# Patient Record
Sex: Male | Born: 1945 | Race: White | Hispanic: No | Marital: Married | Smoking: Never smoker
Health system: Southern US, Community
[De-identification: ages and names within clinical notes are randomized; demographics above are authoritative.]

## PROBLEM LIST (undated history)

## (undated) DIAGNOSIS — K259 Gastric ulcer, unspecified as acute or chronic, without hemorrhage or perforation: Secondary | ICD-10-CM

## (undated) HISTORY — PX: STOMACH SURGERY: SHX791

## (undated) HISTORY — PX: APPENDECTOMY: SHX54

---

## 2004-04-26 ENCOUNTER — Ambulatory Visit: Payer: Self-pay | Admitting: Internal Medicine

## 2004-05-04 ENCOUNTER — Ambulatory Visit: Payer: Self-pay | Admitting: Internal Medicine

## 2004-09-21 ENCOUNTER — Ambulatory Visit: Payer: Self-pay | Admitting: Surgery

## 2005-11-28 ENCOUNTER — Other Ambulatory Visit: Payer: Self-pay

## 2005-11-28 ENCOUNTER — Inpatient Hospital Stay: Payer: Self-pay | Admitting: Surgery

## 2006-11-20 IMAGING — CT CT ABD-PELV W/ CM
1 of 4 series · 10 of 32 positions shown, 16 images · non-contrast
Comparison: none

REASON FOR EXAM: (1) po and iv contrast, abd pain//RM15; (2) po and iv
contrast, flank pain
COMMENTS:

PROCEDURE:     CT  - CT ABDOMEN / PELVIS  W  - November 28, 2005 [DATE]
RESULT:
REASON FOR CONSULTATION: Abdominal pain in the umbilical area.

[Series 2: soft tissue · axial · 0.75mm/px · z∈[-825,-438]mm · 10 of 159 slices shown, 16 images]
[im 15/159  soft-tissue]
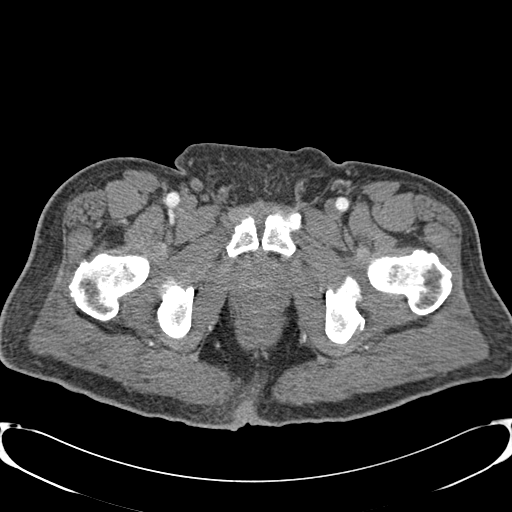
[im 15/159  bone]
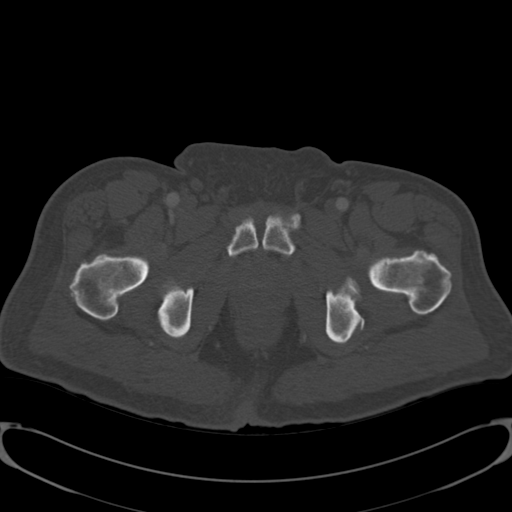
[im 29/159  soft-tissue]
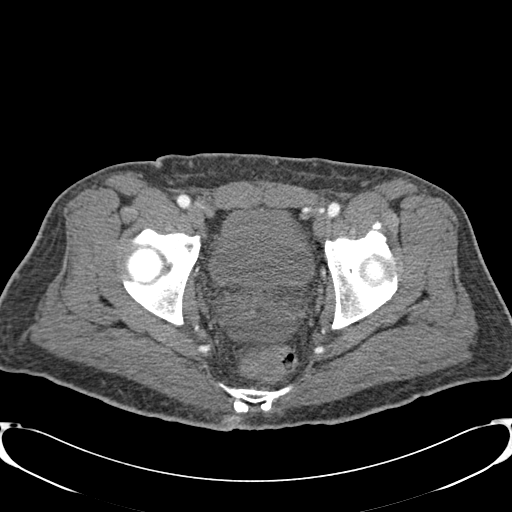
[im 44/159  soft-tissue]
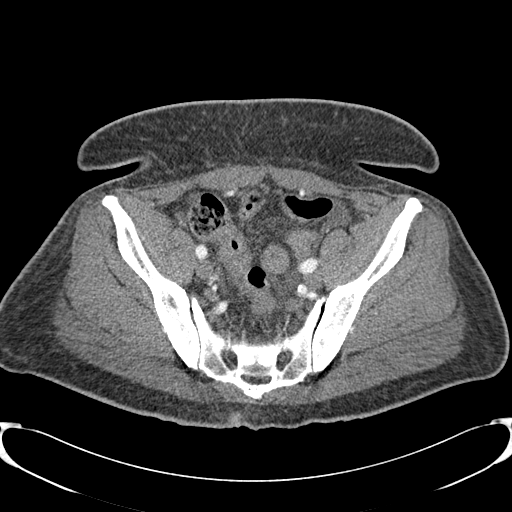
[im 58/159  soft-tissue]
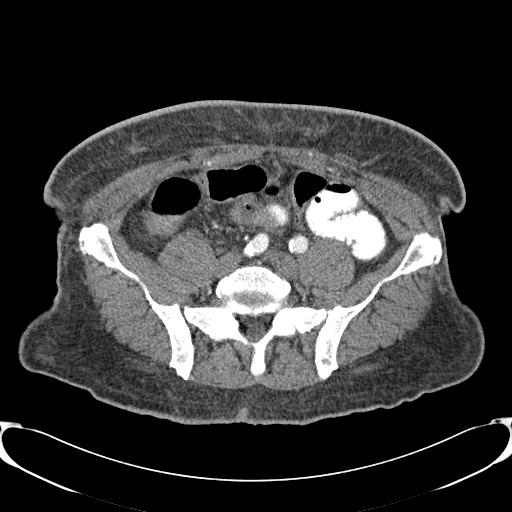
[im 72/159  soft-tissue]
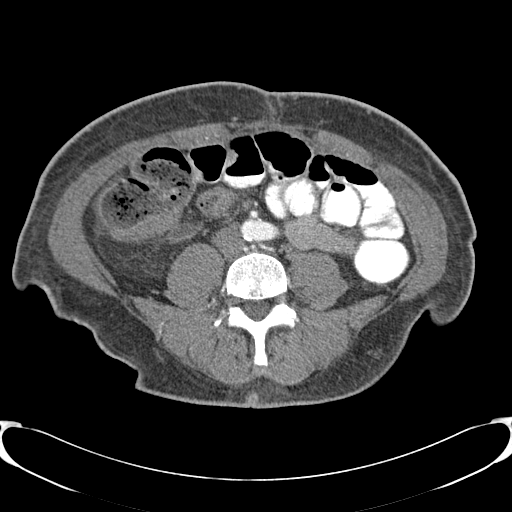
[im 87/159  soft-tissue]
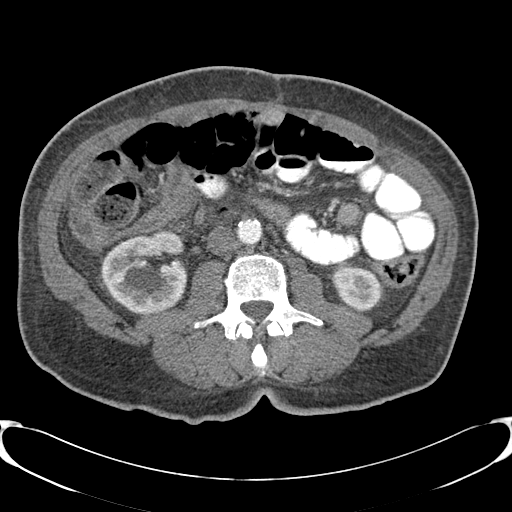
[im 101/159  soft-tissue]
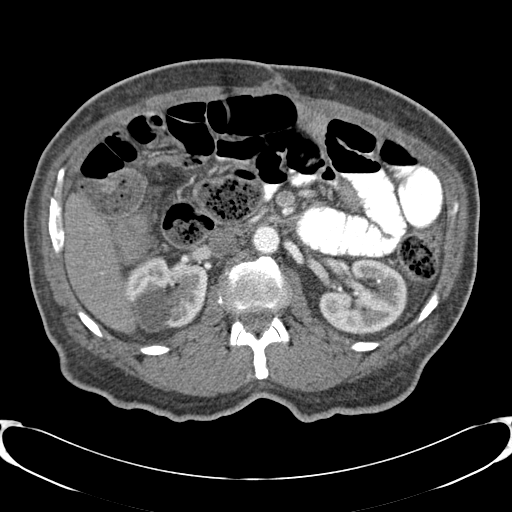
[im 101/159  lung]
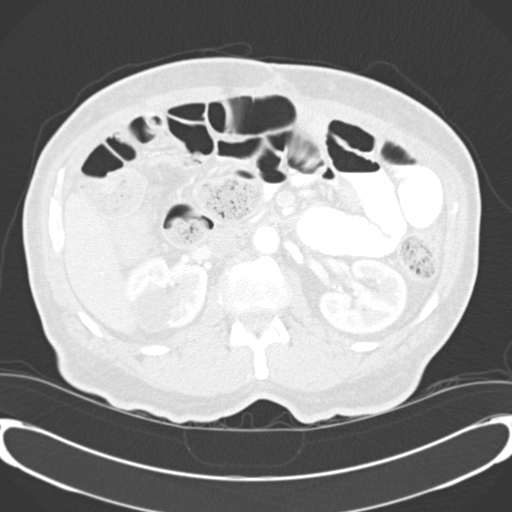
[im 115/159  soft-tissue]
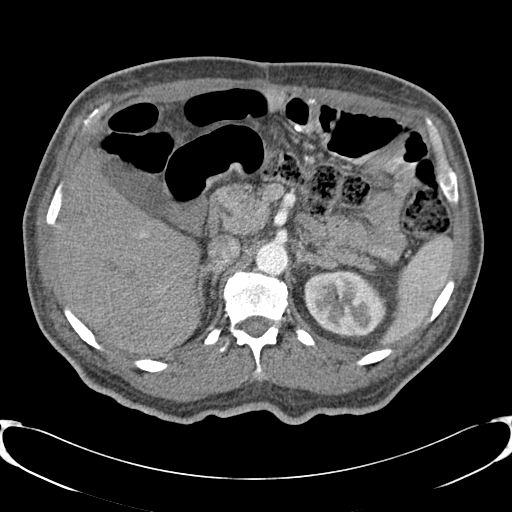
[im 115/159  lung]
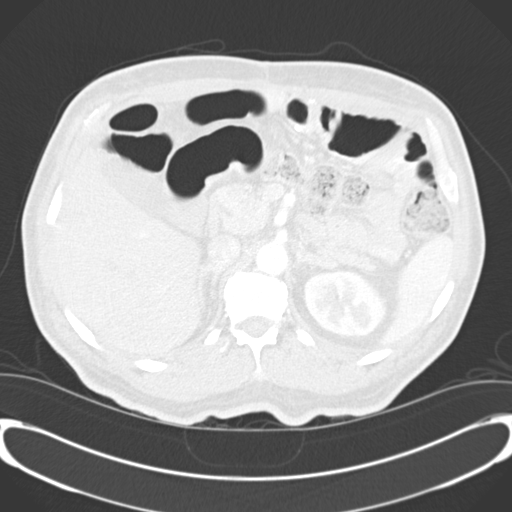
[im 130/159  soft-tissue]
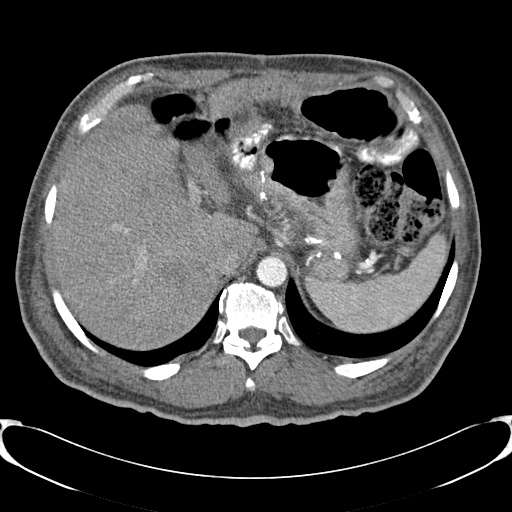
[im 130/159  lung]
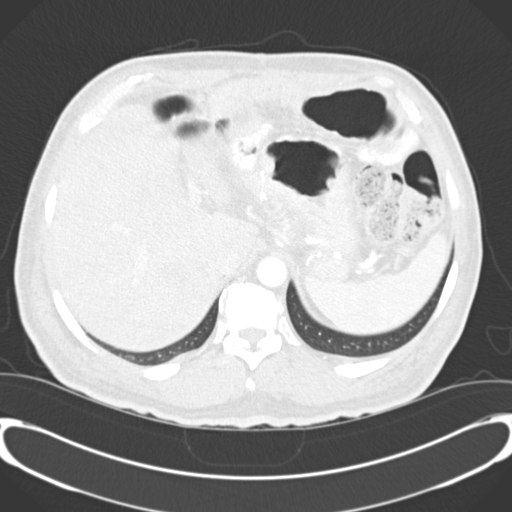
[im 130/159  bone]
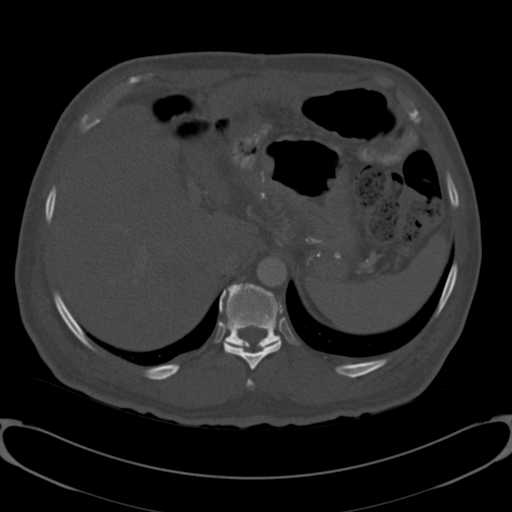
[im 144/159  soft-tissue]
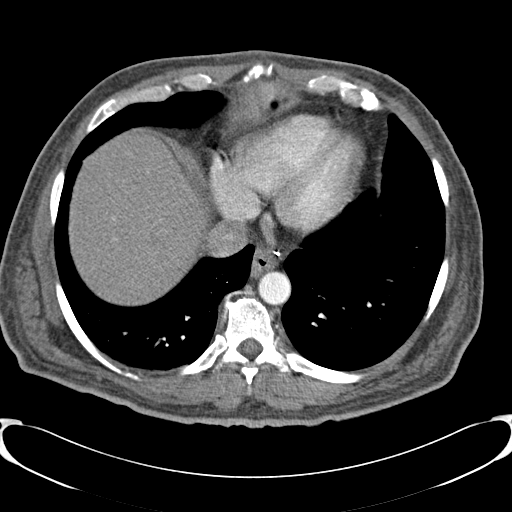
[im 144/159  lung]
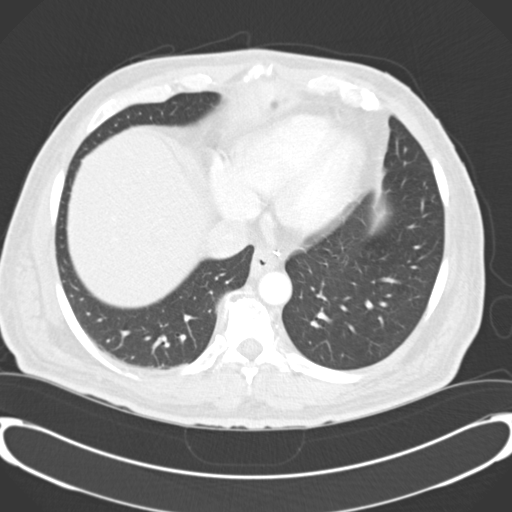

[10 of 32 positions shown; findings below may reference images not displayed]

FINDINGS: IV and oral contrast was administered.  Delayed imaging was also
obtained, however, the barium did not move significantly further in the
colon.  However, it appears that the appendix does appear dilated and
fluid-filled. No surrounding dirtiness to the fat is seen.  The possibility
of appendicitis cannot be completely excluded and should be correlated
clinically.  Renal cysts are identified bilaterally.  There is noted free
fluid in the pelvis.
IMPRESSION: 1)Findings which can represent appendicitis.

The report was called to the [HOSPITAL] the conclusion of the
dictation.

## 2011-05-26 ENCOUNTER — Ambulatory Visit: Payer: Self-pay

## 2013-04-04 ENCOUNTER — Ambulatory Visit: Payer: Self-pay | Admitting: Family Medicine

## 2013-04-04 LAB — DOT URINE DIP
Blood: NEGATIVE
Glucose,UR: NEGATIVE mg/dL (ref 0–75)
Protein: NEGATIVE
Specific Gravity: 1.025 (ref 1.003–1.030)

## 2015-02-08 ENCOUNTER — Encounter: Payer: Self-pay | Admitting: Emergency Medicine

## 2015-02-08 ENCOUNTER — Ambulatory Visit
Admission: EM | Admit: 2015-02-08 | Discharge: 2015-02-08 | Disposition: A | Discharge status: Home / Self Care | Payer: Self-pay | Attending: Family Medicine | Admitting: Family Medicine

## 2015-02-08 DIAGNOSIS — Z024 Encounter for examination for driving license: Secondary | ICD-10-CM

## 2015-02-08 DIAGNOSIS — Z029 Encounter for administrative examinations, unspecified: Secondary | ICD-10-CM

## 2015-02-08 LAB — DEPT OF TRANSP DIPSTICK, URINE (ARMC ONLY)
GLUCOSE, UA: NEGATIVE mg/dL
Hgb urine dipstick: NEGATIVE
Protein, ur: NEGATIVE mg/dL
SPECIFIC GRAVITY, URINE: 1.025 (ref 1.005–1.030)

## 2015-02-08 NOTE — Discharge Instructions (Signed)
Preventive Care for Adults A healthy lifestyle and preventive care can promote health and wellness. Preventive health guidelines for men include the following key practices:  A routine yearly physical is a good way to check with your health care provider about your health and preventative screening. It is a chance to share any concerns and updates on your health and to receive a thorough exam.  Visit your dentist for a routine exam and preventative care every 6 months. Brush your teeth twice a day and floss once a day. Good oral hygiene prevents tooth decay and gum disease.  The frequency of eye exams is based on your age, health, family medical history, use of contact lenses, and other factors. Follow your health care provider's recommendations for frequency of eye exams.  Eat a healthy diet. Foods such as vegetables, fruits, whole grains, low-fat dairy products, and lean protein foods contain the nutrients you need without too many calories. Decrease your intake of foods high in solid fats, added sugars, and salt. Eat the right amount of calories for you.Get information about a proper diet from your health care provider, if necessary.  Regular physical exercise is one of the most important things you can do for your health. Most adults should get at least 150 minutes of moderate-intensity exercise (any activity that increases your heart rate and causes you to sweat) each week. In addition, most adults need muscle-strengthening exercises on 2 or more days a week.  Maintain a healthy weight. The body mass index (BMI) is a screening tool to identify possible weight problems. It provides an estimate of body fat based on height and weight. Your health care provider can find your BMI and can help you achieve or maintain a healthy weight.For adults 20 years and older:  A BMI below 18.5 is considered underweight.  A BMI of 18.5 to 24.9 is normal.  A BMI of 25 to 29.9 is considered overweight.  A BMI  of 30 and above is considered obese.  Maintain normal blood lipids and cholesterol levels by exercising and minimizing your intake of saturated fat. Eat a balanced diet with plenty of fruit and vegetables. Blood tests for lipids and cholesterol should begin at age 76 and be repeated every 5 years. If your lipid or cholesterol levels are high, you are over 50, or you are at high risk for heart disease, you may need your cholesterol levels checked more frequently.Ongoing high lipid and cholesterol levels should be treated with medicines if diet and exercise are not working.  If you smoke, find out from your health care provider how to quit. If you do not use tobacco, do not start.  Lung cancer screening is recommended for adults aged 48-80 years who are at high risk for developing lung cancer because of a history of smoking. A yearly low-dose CT scan of the lungs is recommended for people who have at least a 30-pack-year history of smoking and are a current smoker or have quit within the past 15 years. A pack year of smoking is smoking an average of 1 pack of cigarettes a day for 1 year (for example: 1 pack a day for 30 years or 2 packs a day for 15 years). Yearly screening should continue until the smoker has stopped smoking for at least 15 years. Yearly screening should be stopped for people who develop a health problem that would prevent them from having lung cancer treatment.  If you choose to drink alcohol, do not have more than  2 drinks per day. One drink is considered to be 12 ounces (355 mL) of beer, 5 ounces (148 mL) of wine, or 1.5 ounces (44 mL) of liquor.  Avoid use of street drugs. Do not share needles with anyone. Ask for help if you need support or instructions about stopping the use of drugs.  High blood pressure causes heart disease and increases the risk of stroke. Your blood pressure should be checked at least every 1-2 years. Ongoing high blood pressure should be treated with  medicines, if weight loss and exercise are not effective.  If you are 45-79 years old, ask your health care provider if you should take aspirin to prevent heart disease.  Diabetes screening involves taking a blood sample to check your fasting blood sugar level. This should be done once every 3 years, after age 45, if you are within normal weight and without risk factors for diabetes. Testing should be considered at a younger age or be carried out more frequently if you are overweight and have at least 1 risk factor for diabetes.  Colorectal cancer can be detected and often prevented. Most routine colorectal cancer screening begins at the age of 50 and continues through age 75. However, your health care provider may recommend screening at an earlier age if you have risk factors for colon cancer. On a yearly basis, your health care provider may provide home test kits to check for hidden blood in the stool. Use of a small camera at the end of a tube to directly examine the colon (sigmoidoscopy or colonoscopy) can detect the earliest forms of colorectal cancer. Talk to your health care provider about this at age 50, when routine screening begins. Direct exam of the colon should be repeated every 5-10 years through age 75, unless early forms of precancerous polyps or small growths are found.  People who are at an increased risk for hepatitis B should be screened for this virus. You are considered at high risk for hepatitis B if:  You were born in a country where hepatitis B occurs often. Talk with your health care provider about which countries are considered high risk.  Your parents were born in a high-risk country and you have not received a shot to protect against hepatitis B (hepatitis B vaccine).  You have HIV or AIDS.  You use needles to inject street drugs.  You live with, or have sex with, someone who has hepatitis B.  You are a man who has sex with other men (MSM).  You get hemodialysis  treatment.  You take certain medicines for conditions such as cancer, organ transplantation, and autoimmune conditions.  Hepatitis C blood testing is recommended for all people born from 1945 through 1965 and any individual with known risks for hepatitis C.  Practice safe sex. Use condoms and avoid high-risk sexual practices to reduce the spread of sexually transmitted infections (STIs). STIs include gonorrhea, chlamydia, syphilis, trichomonas, herpes, HPV, and human immunodeficiency virus (HIV). Herpes, HIV, and HPV are viral illnesses that have no cure. They can result in disability, cancer, and death.  If you are at risk of being infected with HIV, it is recommended that you take a prescription medicine daily to prevent HIV infection. This is called preexposure prophylaxis (PrEP). You are considered at risk if:  You are a man who has sex with other men (MSM) and have other risk factors.  You are a heterosexual man, are sexually active, and are at increased risk for HIV infection.    infection.  You take drugs by injection.  You are sexually active with a partner who has HIV.  Talk with your health care provider about whether you are at high risk of being infected with HIV. If you choose to begin PrEP, you should first be tested for HIV. You should then be tested every 3 months for as long as you are taking PrEP.  A one-time screening for abdominal aortic aneurysm (AAA) and surgical repair of large AAAs by ultrasound are recommended for men ages 72 to 53 years who are current or former smokers.  Healthy men should no longer receive prostate-specific antigen (PSA) blood tests as part of routine cancer screening. Talk with your health care provider about prostate cancer screening.  Testicular cancer screening is not recommended for adult males who have no symptoms. Screening includes self-exam, a health care provider exam, and other screening tests. Consult with your health care provider about any symptoms  you have or any concerns you have about testicular cancer.  Use sunscreen. Apply sunscreen liberally and repeatedly throughout the day. You should seek shade when your shadow is shorter than you. Protect yourself by wearing long sleeves, pants, a wide-brimmed hat, and sunglasses year round, whenever you are outdoors.  Once a month, do a whole-body skin exam, using a mirror to look at the skin on your back. Tell your health care provider about new moles, moles that have irregular borders, moles that are larger than a pencil eraser, or moles that have changed in shape or color.  Stay current with required vaccines (immunizations).  Influenza vaccine. All adults should be immunized every year.  Tetanus, diphtheria, and acellular pertussis (Td, Tdap) vaccine. An adult who has not previously received Tdap or who does not know his vaccine status should receive 1 dose of Tdap. This initial dose should be followed by tetanus and diphtheria toxoids (Td) booster doses every 10 years. Adults with an unknown or incomplete history of completing a 3-dose immunization series with Td-containing vaccines should begin or complete a primary immunization series including a Tdap dose. Adults should receive a Td booster every 10 years.  Varicella vaccine. An adult without evidence of immunity to varicella should receive 2 doses or a second dose if he has previously received 1 dose.  Human papillomavirus (HPV) vaccine. Males aged 54-21 years who have not received the vaccine previously should receive the 3-dose series. Males aged 22-26 years may be immunized. Immunization is recommended through the age of 50 years for any male who has sex with males and did not get any or all doses earlier. Immunization is recommended for any person with an immunocompromised condition through the age of 59 years if he did not get any or all doses earlier. During the 3-dose series, the second dose should be obtained 4-8 weeks after the first  dose. The third dose should be obtained 24 weeks after the first dose and 16 weeks after the second dose.  Zoster vaccine. One dose is recommended for adults aged 58 years or older unless certain conditions are present.  Measles, mumps, and rubella (MMR) vaccine. Adults born before 75 generally are considered immune to measles and mumps. Adults born in [birth year] or later should have 1 or more doses of MMR vaccine unless there is a contraindication to the vaccine or there is laboratory evidence of immunity to each of the three diseases. A routine second dose of MMR vaccine should be obtained at least 28 days after the first dose for students  attending postsecondary schools, health care workers, or international travelers. People who received inactivated measles vaccine or an unknown type of measles vaccine during 1963-1967 should receive 2 doses of MMR vaccine. People who received inactivated mumps vaccine or an unknown type of mumps vaccine before 1979 and are at high risk for mumps infection should consider immunization with 2 doses of MMR vaccine. Unvaccinated health care workers born before [birth year] who lack laboratory evidence of measles, mumps, or rubella immunity or laboratory confirmation of disease should consider measles and mumps immunization with 2 doses of MMR vaccine or rubella immunization with 1 dose of MMR vaccine.  Pneumococcal 13-valent conjugate (PCV13) vaccine. When indicated, a person who is uncertain of his immunization history and has no record of immunization should receive the PCV13 vaccine. An adult aged 15 years or older who has certain medical conditions and has not been previously immunized should receive 1 dose of PCV13 vaccine. This PCV13 should be followed with a dose of pneumococcal polysaccharide (PPSV23) vaccine. The PPSV23 vaccine dose should be obtained at least 8 weeks after the dose of PCV13 vaccine. An adult aged 56 years or older who has certain medical conditions and  previously received 1 or more doses of PPSV23 vaccine should receive 1 dose of PCV13. The PCV13 vaccine dose should be obtained 1 or more years after the last PPSV23 vaccine dose.  Pneumococcal polysaccharide (PPSV23) vaccine. When PCV13 is also indicated, PCV13 should be obtained first. All adults aged 37 years and older should be immunized. An adult younger than age 32 years who has certain medical conditions should be immunized. Any person who resides in a nursing home or long-term care facility should be immunized. An adult smoker should be immunized. People with an immunocompromised condition and certain other conditions should receive both PCV13 and PPSV23 vaccines. People with human immunodeficiency virus (HIV) infection should be immunized as soon as possible after diagnosis. Immunization during chemotherapy or radiation therapy should be avoided. Routine use of PPSV23 vaccine is not recommended for American Indians, [place]atives, or people younger than 65 years unless there are medical conditions that require PPSV23 vaccine. When indicated, people who have unknown immunization and have no record of immunization should receive PPSV23 vaccine. One-time revaccination 5 years after the first dose of PPSV23 is recommended for people aged 19-64 years who have chronic kidney failure, nephrotic syndrome, asplenia, or immunocompromised conditions. People who received 1-2 doses of PPSV23 before age 2 years should receive another dose of PPSV23 vaccine at age 18 years or later if at least 5 years have passed since the previous dose. Doses of PPSV23 are not needed for people immunized with PPSV23 at or after age 29 years.  Meningococcal vaccine. Adults with asplenia or persistent complement component deficiencies should receive 2 doses of quadrivalent meningococcal conjugate (MenACWY-D) vaccine. The doses should be obtained at least 2 months apart. Microbiologists working with certain meningococcal bacteria,  [employer] recruits, people at risk during an outbreak, and people who travel to or live in countries with a high rate of meningitis should be immunized. A first-year college student up through age 15 years who is living in a residence hall should receive a dose if he did not receive a dose on or after his 16th birthday. Adults who have certain high-risk conditions should receive one or more doses of vaccine.  Hepatitis A vaccine. Adults who wish to be protected from this disease, have certain high-risk conditions, work with hepatitis A-infected animals, work in hepatitis A research  travel to or work in countries with a high rate of hepatitis A should be immunized. Adults who were previously unvaccinated and who anticipate close contact with an international adoptee during the first 60 days after arrival in the [place] from a country with a high rate of hepatitis A should be immunized.  Hepatitis B vaccine. Adults should be immunized if they wish to be protected from this disease, have certain high-risk conditions, may be exposed to blood or other infectious body fluids, are household contacts or sex partners of hepatitis B positive people, are clients or workers in certain care facilities, or travel to or work in countries with a high rate of hepatitis B.  Haemophilus influenzae type b (Hib) vaccine. A previously unvaccinated person with asplenia or sickle cell disease or having a scheduled splenectomy should receive 1 dose of Hib vaccine. Regardless of previous immunization, a recipient of a hematopoietic stem cell transplant should receive a 3-dose series 6-12 months after his successful transplant. Hib vaccine is not recommended for adults with HIV infection. Preventive Service / Frequency Ages 81 to 7  Blood pressure check.** / Every 1 to 2 years.  Lipid and cholesterol check.** / Every 5 years beginning at age 49.  Hepatitis C blood test.** / For any individual with known risks for  hepatitis C.  Skin self-exam. / Monthly.  Influenza vaccine. / Every year.  Tetanus, diphtheria, and acellular pertussis (Tdap, Td) vaccine.** / Consult your health care provider. 1 dose of Td every 10 years.  Varicella vaccine.** / Consult your health care provider.  HPV vaccine. / 3 doses over 6 months, if 67 or younger.  Measles, mumps, rubella (MMR) vaccine.** / You need at least 1 dose of MMR if you were born in 1957 or later. You may also need a second dose.  Pneumococcal 13-valent conjugate (PCV13) vaccine.** / Consult your health care provider.  Pneumococcal polysaccharide (PPSV23) vaccine.** / 1 to 2 doses if you smoke cigarettes or if you have certain conditions.  Meningococcal vaccine.** / 1 dose if you are age 28 to 74 years and a Market researcher living in a residence hall, or have one of several medical conditions. You may also need additional booster doses.  Hepatitis A vaccine.** / Consult your health care provider.  Hepatitis B vaccine.** / Consult your health care provider.  Haemophilus influenzae type b (Hib) vaccine.** / Consult your health care provider. Ages 35 to 13  Blood pressure check.** / Every 1 to 2 years.  Lipid and cholesterol check.** / Every 5 years beginning at age 12.  Lung cancer screening. / Every year if you are aged 35-80 years and have a 30-pack-year history of smoking and currently smoke or have quit within the past 15 years. Yearly screening is stopped once you have quit smoking for at least 15 years or develop a health problem that would prevent you from having lung cancer treatment.  Fecal occult blood test (FOBT) of stool. / Every year beginning at age 60 and continuing until age 42. You may not have to do this test if you get a colonoscopy every 10 years.  Flexible sigmoidoscopy** or colonoscopy.** / Every 5 years for a flexible sigmoidoscopy or every 10 years for a colonoscopy beginning at age 83 and continuing until age  50.  Hepatitis C blood test.** / For all people born from [birth year] through 1965 and any individual with known risks for hepatitis C.  Skin self-exam. / Monthly.  Influenza vaccine. / Every  year.  Tetanus, diphtheria, and acellular pertussis (Tdap/Td) vaccine.** / Consult your health care provider. 1 dose of Td every 10 years.  Varicella vaccine.** / Consult your health care provider.  Zoster vaccine.** / 1 dose for adults aged 60 years or older.  Measles, mumps, rubella (MMR) vaccine.** / You need at least 1 dose of MMR if you were born in 1957 or later. You may also need a second dose.  Pneumococcal 13-valent conjugate (PCV13) vaccine.** / Consult your health care provider.  Pneumococcal polysaccharide (PPSV23) vaccine.** / 1 to 2 doses if you smoke cigarettes or if you have certain conditions.  Meningococcal vaccine.** / Consult your health care provider.  Hepatitis A vaccine.** / Consult your health care provider.  Hepatitis B vaccine.** / Consult your health care provider.  Haemophilus influenzae type b (Hib) vaccine.** / Consult your health care provider. Ages 65 and over  Blood pressure check.** / Every 1 to 2 years.  Lipid and cholesterol check.**/ Every 5 years beginning at age 20.  Lung cancer screening. / Every year if you are aged 55-80 years and have a 30-pack-year history of smoking and currently smoke or have quit within the past 15 years. Yearly screening is stopped once you have quit smoking for at least 15 years or develop a health problem that would prevent you from having lung cancer treatment.  Fecal occult blood test (FOBT) of stool. / Every year beginning at age 50 and continuing until age 75. You may not have to do this test if you get a colonoscopy every 10 years.  Flexible sigmoidoscopy** or colonoscopy.** / Every 5 years for a flexible sigmoidoscopy or every 10 years for a colonoscopy beginning at age 50 and continuing until age 75.  Hepatitis C blood  test.** / For all people born from 1945 through 1965 and any individual with known risks for hepatitis C.  Abdominal aortic aneurysm (AAA) screening.** / A one-time screening for ages 65 to 75 years who are current or former smokers.  Skin self-exam. / Monthly.  Influenza vaccine. / Every year.  Tetanus, diphtheria, and acellular pertussis (Tdap/Td) vaccine.** / 1 dose of Td every 10 years.  Varicella vaccine.** / Consult your health care provider.  Zoster vaccine.** / 1 dose for adults aged 60 years or older.  Pneumococcal 13-valent conjugate (PCV13) vaccine.** / Consult your health care provider.  Pneumococcal polysaccharide (PPSV23) vaccine.** / 1 dose for all adults aged 65 years and older.  Meningococcal vaccine.** / Consult your health care provider.  Hepatitis A vaccine.** / Consult your health care provider.  Hepatitis B vaccine.** / Consult your health care provider.  Haemophilus influenzae type b (Hib) vaccine.** / Consult your health care provider. **Family history and personal history of risk and conditions may change your health care provider's recommendations. Document Released: 07/25/2001 Document Revised: 06/03/2013 Document Reviewed: 10/24/2010 [employer] Patient Information 2015 [employer]. This information is not intended to replace advice given to you by your health care provider. Make sure you discuss any questions you have with your health care provider.  

## 2015-02-08 NOTE — ED Notes (Signed)
DOT physical. 

## 2015-02-08 NOTE — ED Provider Notes (Signed)
CSN: [id number]     Arrival date & time 02/08/15  4098 History   First MD Initiated Contact with Patient 02/08/15 [phone]     Chief Complaint  Patient presents with  . Employment Physical   (Consider location/radiation/quality/duration/timing/severity/associated sxs/prior Treatment) No language interpreter was used.    History reviewed. No pertinent past medical history. Past Surgical History  Procedure Laterality Date  . Stomach surgery      for ulcer   History reviewed. No pertinent family history. Social History  Substance Use Topics  . Smoking status: Never Smoker   . Smokeless tobacco: None  . Alcohol Use: No    Review of Systems  Constitutional: Negative.   Skin: Negative.   All other systems reviewed and are negative.   Allergies  Review of patient's allergies indicates no known allergies.  Home Medications   Prior to Admission medications   Medication Sig Start Date End Date Taking? Authorizing Provider  calcium carbonate (OS-CAL) 600 MG TABS tablet Take 600 mg by mouth 2 (two) times daily with a meal.   Yes Historical Provider, MD  Cyanocobalamin (VITAMIN B 12 PO) Take by mouth.   Yes Historical Provider, MD  Multiple Vitamin (MULTIVITAMIN) tablet Take 1 tablet by mouth daily.   Yes Historical Provider, MD   Meds Ordered and Administered this Visit  Medications - No data to display  BP 118/58 mmHg  Pulse 56  Temp(Src) 97.8 F (36.6 C) (Oral)  Resp 16  Ht  (1.702 m)  Wt 201 lb 8 oz (91.4 kg)  BMI 31.55 kg/m2  SpO2 100% No data found.   Physical Exam  Constitutional: He is oriented to person, place, and time. He appears well-developed.  HENT:  Head: Normocephalic.  Right Ear: External ear normal.  Left Ear: External ear normal.  Patient has hearing aids in both ears but past test without the hearing aids present  Eyes: Pupils are equal, round, and reactive to light.  Cardiovascular: Normal rate and regular rhythm.   Pulmonary/Chest: Effort  normal.  Abdominal: Soft. Bowel sounds are normal.    Scar present midline from previous ulcer surgery.  Musculoskeletal: Normal range of motion.  Neurological: He is oriented to person, place, and time.  Skin: Skin is warm. No erythema.  Psychiatric: He has a normal mood and affect.  Vitals reviewed.   ED Course  Procedures (including critical care time)  Labs Review Labs Reviewed  DEPT OF TRANSP DIPSTICK, URINE([facility] ONLY)    Imaging Review No results found.   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

## 2017-01-22 ENCOUNTER — Encounter: Payer: Self-pay | Admitting: Emergency Medicine

## 2017-01-22 ENCOUNTER — Ambulatory Visit
Admission: EM | Admit: 2017-01-22 | Discharge: 2017-01-22 | Disposition: A | Discharge status: Home / Self Care | Payer: Self-pay | Attending: Family Medicine | Admitting: Family Medicine

## 2017-01-22 DIAGNOSIS — Z0289 Encounter for other administrative examinations: Secondary | ICD-10-CM

## 2017-01-22 HISTORY — DX: Gastric ulcer, unspecified as acute or chronic, without hemorrhage or perforation: K25.9

## 2017-01-22 LAB — DEPT OF TRANSP DIPSTICK, URINE (ARMC ONLY)
GLUCOSE, UA: NEGATIVE mg/dL
Hgb urine dipstick: NEGATIVE
Protein, ur: NEGATIVE mg/dL
SPECIFIC GRAVITY, URINE: 1.025 (ref 1.005–1.030)

## 2017-01-22 NOTE — ED Provider Notes (Signed)
[facility]    CSN: [id number] Arrival date & time: 01/22/17  9562     History   Chief Complaint Chief Complaint  Patient presents with  . DOT Physical    HPI Thomas Bowen is a 71 y.o. male.   Patient here for DOT Physical (see scanned form)   The history is provided by the patient.    Past Medical History:  Diagnosis Date  . Stomach ulcer     There are no active problems to display for this patient.   Past Surgical History:  Procedure Laterality Date  . APPENDECTOMY    . STOMACH SURGERY     for ulcer       Home Medications    Prior to Admission medications   Medication Sig Start Date End Date Taking? Authorizing Provider  calcium carbonate (OS-CAL) 600 MG TABS tablet Take 600 mg by mouth 2 (two) times daily with a meal.    [provider]  Cyanocobalamin (VITAMIN B 12 PO) Take by mouth.    [provider]  Multiple Vitamin (MULTIVITAMIN) tablet Take 1 tablet by mouth daily.    [provider]    Family History History reviewed. No pertinent family history.  Social History Social History  Substance Use Topics  . Smoking status: Never Smoker  . Smokeless tobacco: Never Used  . Alcohol use No     Allergies   Patient has no known allergies.   Review of Systems Review of Systems   Physical Exam Triage Vital Signs ED Triage Vitals  Enc Vitals Group     BP 01/22/17 0941 130/78     Pulse Rate 01/22/17 0941 62     Resp 01/22/17 0941 16     Temp 01/22/17 0941 98.2 F (36.8 C)     Temp Source 01/22/17 0941 Oral     SpO2 01/22/17 0941 99 %     Weight 01/22/17 0938 228 lb (103.4 kg)     Height 01/22/17 0938 5\' 6"  (1.676 m)     Head Circumference --      Peak Flow --      Pain Score 01/22/17 0939 0     Pain Loc --      Pain Edu? --      Excl. in GC? --    No data found.   Updated Vital Signs BP 130/78 (BP Location: Right Arm)   Pulse 62   Temp 98.2 F (36.8 C) (Oral)   Resp 16   Ht 5\' 6"   (1.676 m)   Wt 228 lb (103.4 kg)   SpO2 99%   BMI 36.80 kg/m   Visual Acuity Right Eye Distance: 20/25 uncorrected Left Eye Distance: 20/20 uncorrected Bilateral Distance: 20/20 uncorrected  Right Eye Near:   Left Eye Near:    Bilateral Near:     Physical Exam   UC Treatments / Results  Labs (all labs ordered are listed, but only abnormal results are displayed) Labs Reviewed  DEPT OF TRANSP DIPSTICK, URINE([facility] ONLY)    EKG  EKG Interpretation None       Radiology No results found.  Procedures Procedures (including critical care time)  Medications Ordered in UC Medications - No data to display   Initial Impression / Assessment and Plan / UC Course  I have reviewed the triage vital signs and the nursing notes.  Pertinent labs & imaging results that were available during my care of the patient were reviewed by me and considered in  my medical decision making (see chart for details).       Final Clinical Impressions(s) / UC Diagnoses   Final diagnoses:  Encounter for examination required by Department of Transportation (DOT)    New Prescriptions New Prescriptions   No medications on file   DOT Physical (medically qualified for 2 year; see scanned form)   Controlled Substance Prescriptions [place] Controlled Substance Registry consulted? Not Applicable   Payton Mccallumonty, Chabely Norby, MD 01/22/17 1047

## 2017-01-22 NOTE — ED Triage Notes (Signed)
Patient here for DOT Physical.  

## 2022-01-10 DEATH — deceased
